# Patient Record
Sex: Male | Born: 1965 | Race: Black or African American | Hispanic: No | Smoking: Never smoker
Health system: Southern US, Community
[De-identification: ages and names within clinical notes are randomized; demographics above are authoritative.]

## PROBLEM LIST (undated history)

## (undated) DIAGNOSIS — I669 Occlusion and stenosis of unspecified cerebral artery: Secondary | ICD-10-CM

## (undated) HISTORY — PX: KNEE SURGERY: SHX244

---

## 2008-01-08 ENCOUNTER — Emergency Department (HOSPITAL_COMMUNITY): Admission: EM | Admit: 2008-01-08 | Discharge: 2008-01-08 | Payer: Self-pay | Admitting: Emergency Medicine

## 2010-11-28 ENCOUNTER — Inpatient Hospital Stay (INDEPENDENT_AMBULATORY_CARE_PROVIDER_SITE_OTHER)
Admission: RE | Admit: 2010-11-28 | Discharge: 2010-11-28 | Disposition: A | Discharge status: Home / Self Care | Payer: Private Health Insurance - Indemnity | Source: Ambulatory Visit | Attending: Family Medicine | Admitting: Family Medicine

## 2010-11-28 DIAGNOSIS — R197 Diarrhea, unspecified: Secondary | ICD-10-CM

## 2010-11-28 DIAGNOSIS — R112 Nausea with vomiting, unspecified: Secondary | ICD-10-CM

## 2010-11-28 LAB — POCT I-STAT, CHEM 8
Calcium, Ion: 1.2 mmol/L (ref 1.12–1.32)
Chloride: 104 mEq/L (ref 96–112)
Glucose, Bld: 116 mg/dL — ABNORMAL HIGH (ref 70–99)
HCT: 58 % — ABNORMAL HIGH (ref 39.0–52.0)
TCO2: 26 mmol/L (ref 0–100)

## 2010-11-28 LAB — POCT URINALYSIS DIPSTICK
Hgb urine dipstick: NEGATIVE
Nitrite: NEGATIVE
Protein, ur: 100 mg/dL — AB
Urobilinogen, UA: 1 mg/dL (ref 0.0–1.0)
pH: 5.5 (ref 5.0–8.0)

## 2011-07-03 LAB — CBC
HCT: 46.7
Platelets: 252
RDW: 13.4
WBC: 8.3

## 2011-07-03 LAB — DIFFERENTIAL
Basophils Absolute: 0
Eosinophils Absolute: 0.5
Eosinophils Relative: 7 — ABNORMAL HIGH
Lymphocytes Relative: 27
Lymphs Abs: 2.3
Neutrophils Relative %: 57

## 2011-07-03 LAB — POCT I-STAT, CHEM 8
BUN: 10
Hemoglobin: 16.3
Potassium: 3.9
Sodium: 140
TCO2: 23

## 2011-07-03 LAB — POCT CARDIAC MARKERS
CKMB, poc: 3.1
Myoglobin, poc: 93.1
Operator id: 285841

## 2013-04-06 ENCOUNTER — Emergency Department (HOSPITAL_COMMUNITY)
Admission: EM | Admit: 2013-04-06 | Discharge: 2013-04-06 | Disposition: A | Discharge status: Home / Self Care | Payer: Managed Care, Other (non HMO) | Attending: Emergency Medicine | Admitting: Emergency Medicine

## 2013-04-06 ENCOUNTER — Emergency Department (HOSPITAL_COMMUNITY): Payer: Managed Care, Other (non HMO)

## 2013-04-06 ENCOUNTER — Encounter (HOSPITAL_COMMUNITY): Payer: Self-pay | Admitting: *Deleted

## 2013-04-06 DIAGNOSIS — M545 Low back pain, unspecified: Secondary | ICD-10-CM | POA: Insufficient documentation

## 2013-04-06 DIAGNOSIS — Z8669 Personal history of other diseases of the nervous system and sense organs: Secondary | ICD-10-CM | POA: Insufficient documentation

## 2013-04-06 HISTORY — DX: Occlusion and stenosis of unspecified cerebral artery: I66.9

## 2013-04-06 LAB — URINALYSIS, ROUTINE W REFLEX MICROSCOPIC
Glucose, UA: NEGATIVE mg/dL
Hgb urine dipstick: NEGATIVE
Ketones, ur: NEGATIVE mg/dL
Protein, ur: NEGATIVE mg/dL
Urobilinogen, UA: 0.2 mg/dL (ref 0.0–1.0)

## 2013-04-06 LAB — POCT I-STAT, CHEM 8
BUN: 14 mg/dL (ref 6–23)
Calcium, Ion: 1.15 mmol/L (ref 1.12–1.23)
Creatinine, Ser: 0.8 mg/dL (ref 0.50–1.35)
Glucose, Bld: 113 mg/dL — ABNORMAL HIGH (ref 70–99)
TCO2: 24 mmol/L (ref 0–100)

## 2013-04-06 MED ORDER — METHOCARBAMOL 500 MG PO TABS: 500.0000 mg | ORAL_TABLET | Freq: Two times a day (BID) | ORAL | Status: DC

## 2013-04-06 MED ORDER — HYDROCODONE-ACETAMINOPHEN 5-325 MG PO TABS: 1.0000 | ORAL_TABLET | Freq: Four times a day (QID) | ORAL | Status: DC | PRN

## 2013-04-06 NOTE — ED Provider Notes (Signed)
History    CSN: [id number] Arrival date & time 04/06/13  0711  First MD Initiated Contact with Patient 04/06/13 0725     Chief Complaint  Patient presents with  . Flank Pain   (Consider location/radiation/quality/duration/timing/severity/associated sxs/prior Treatment) HPI Comments: Patient presents with a chief complaint of left flank pain.  He reports that he began having the pain last evening.  Pain has been intermittent.  He states that at times the pain becomes very sharp and will then ease up.  At this time he states that his pain is controlled.  He has not taken anything for pain prior to arrival.  He denies nausea, vomiting, or diarrhea.  He denies hematuria, dysuria, difficulty urinating, increased urinary frequency, or urgency.  Denies fever or chills.  He denies prior history of Kidney Stones.  He denies any recent injury or trauma to the back.  Denies any recent heavy lifting or bending.  Denies numbness or tingling.  Denies bowel or bladder incontinence.    Patient is a 47 y.o. male presenting with flank pain. The history is provided by the patient.  Flank Pain   Past Medical History  Diagnosis Date  . Blood clots in brain     in 1986   Past Surgical History  Procedure Laterality Date  . Knee surgery      in 1984   No family history on file. History  Substance Use Topics  . Smoking status: Never Smoker   . Smokeless tobacco: Not on file  . Alcohol Use: No    Review of Systems  Genitourinary: Positive for flank pain.  All other systems reviewed and are negative.    Allergies  Review of patient's allergies indicates no known allergies.  Home Medications  No current outpatient prescriptions on file. BP 126/71  Pulse 71  Temp(Src) 98 F (36.7 C) (Oral)  Resp 16  SpO2 94% Physical Exam  Nursing note and vitals reviewed. Constitutional: He appears well-developed and well-nourished. No distress.  HENT:  Head: Normocephalic and atraumatic.   Cardiovascular: Normal rate, regular rhythm and normal heart sounds.   Pulmonary/Chest: Effort normal and breath sounds normal.  Abdominal: Soft. Bowel sounds are normal. He exhibits no distension and no mass. There is no tenderness. There is CVA tenderness. There is no rebound and no guarding.  Left CVA tenderness  Musculoskeletal: Normal range of motion.  Neurological: He is alert. He has normal strength. No sensory deficit. Gait normal.  Reflex Scores:      Patellar reflexes are 2+ on the right side and 2+ on the left side.      Achilles reflexes are 2+ on the right side and 2+ on the left side. Skin: Skin is warm and dry. He is not diaphoretic.  Psychiatric: He has a normal mood and affect.    ED Course  Procedures (including critical care time) Labs Reviewed  URINALYSIS, ROUTINE W REFLEX MICROSCOPIC   Ct Abdomen Pelvis Wo Contrast  04/06/2013   *RADIOLOGY REPORT*  Clinical Data: Left flank pain.  Rule out kidney stone  CT ABDOMEN AND PELVIS WITHOUT CONTRAST  Technique:  Multidetector CT imaging of the abdomen and pelvis was performed following the standard protocol without intravenous contrast.  Comparison: None.  Findings: The lung bases appear clear.  There is no pleural or pericardial effusion.  No focal liver abnormalities identified.  The gallbladder appears normal.  No biliary dilatation.  Normal appearance of the pancreas. The spleen is unremarkable.  The adrenal glands are  normal.  Normal appearance of the right kidney.  The left kidney appears within normal limits.  There is no evidence for hydronephrosis or hydroureter.  No ureterolithiasis identified.  The urinary bladder appears normal.  Prostate gland and seminal vesicles are unremarkable.  The stomach appears normal.  The small bowel loops have a normal course and caliber.  No obstruction.  Normal appearance of the appendix.  Multiple colonic diverticula are identified.  No evidence for acute inflammation.  Normal caliber of  the abdominal aorta.  No aneurysm identified. There is no upper abdominal or pelvic adenopathy noted.  No free fluid or fluid collections within the abdomen or pelvis.  Review of the visualized osseous structures is unremarkable.  No worrisome lytic or sclerotic bone lesions.  IMPRESSION:  1.  Negative for nephrolithiasis or hydronephrosis. 2.  Colonic diverticula without acute inflammation.   Original Report Authenticated By: Signa Kell, M.D.   No diagnosis found.  MDM  Patient presenting with left flank pain that has been intermittent since yesterday.  No acute injury or trauma.  UA does not show any evidence of infection.  Patient afebrile.  No acute findings on CT ab/pelvis w/o contrast.  CT negative for nephrolithiasis.  Pain could be musculoskeletal.  Patient without any red flags of back pain.  No signs of cauda equina.  Feel that the patient is stable for discharge.  Return precautions given.  Pascal Lux Clayton, PA-C 04/06/13 1046

## 2013-04-06 NOTE — ED Provider Notes (Signed)
Medical screening examination/treatment/procedure(s) were performed by non-physician practitioner and as supervising physician I was immediately available for consultation/collaboration.    Mira Balon R Vaughn Frieze, MD 04/06/13 1602 

## 2013-04-06 NOTE — ED Notes (Addendum)
Pt reports intermittent sharp left sided flank pain started last night, 8/10 at most intense times. Pt denies trauma or injury. Denies dysuria, n/v/d. Reports no hx of kidney stones.

## 2014-07-23 IMAGING — CT CT ABD-PELV W/O CM
1 series · 14 of 32 positions shown, 17 images · non-contrast
Comparison: None.

CLINICAL DATA: Left flank pain.  Rule out kidney stone

CT ABDOMEN AND PELVIS WITHOUT CONTRAST
TECHNIQUE: Multidetector CT imaging of the abdomen and pelvis was
performed following the standard protocol without intravenous
contrast.

[Series 6: sagittal · sagittal · 0.76mm/px · 14 of 128 slices shown, 17 images]
[im 5/128  lung]
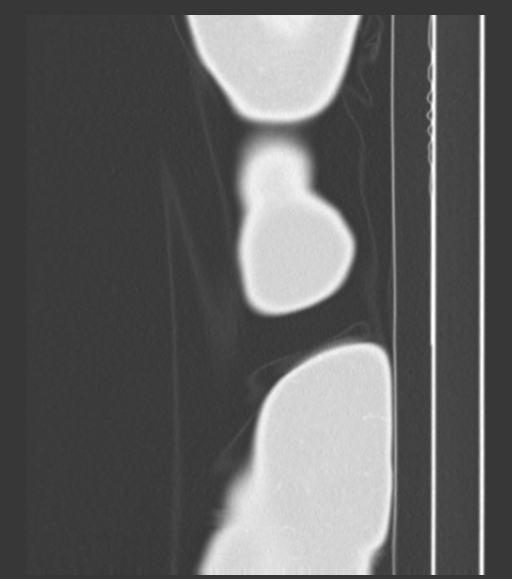
[im 9/128  soft-tissue]
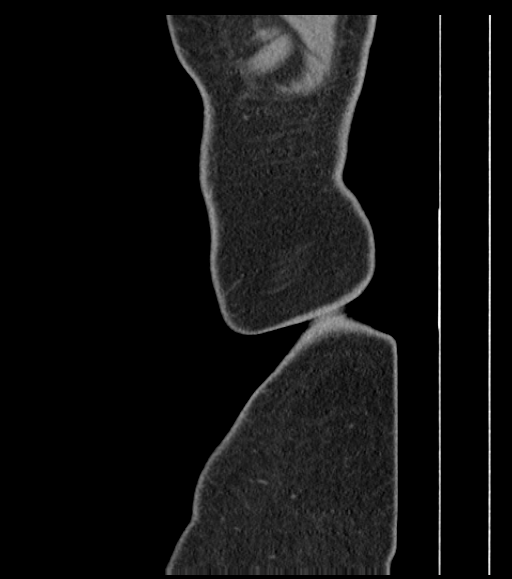
[im 9/128  lung]
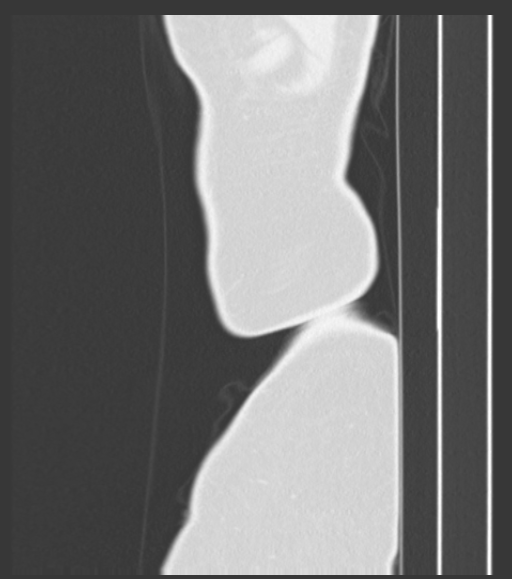
[im 9/128  bone]
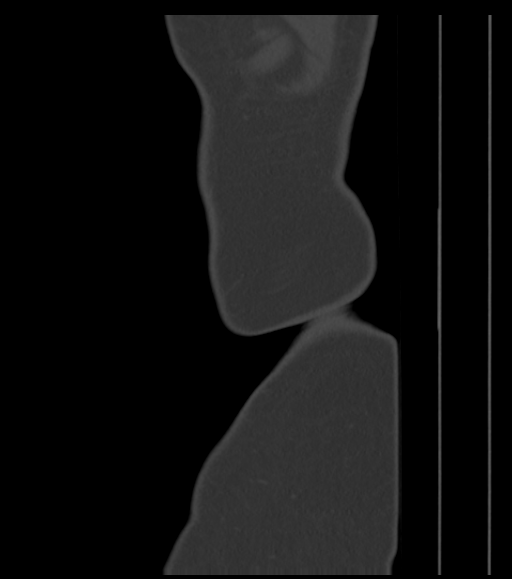
[im 13/128  lung]
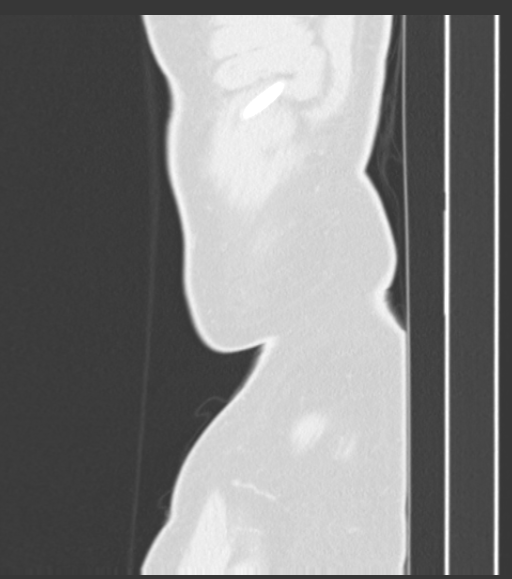
[im 17/128  lung]
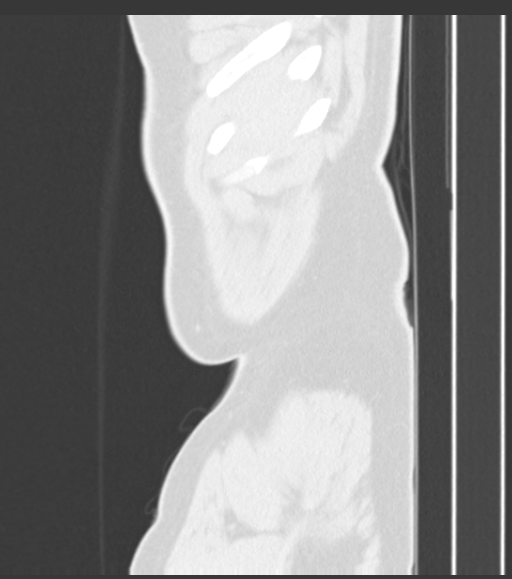
[im 21/128  soft-tissue]
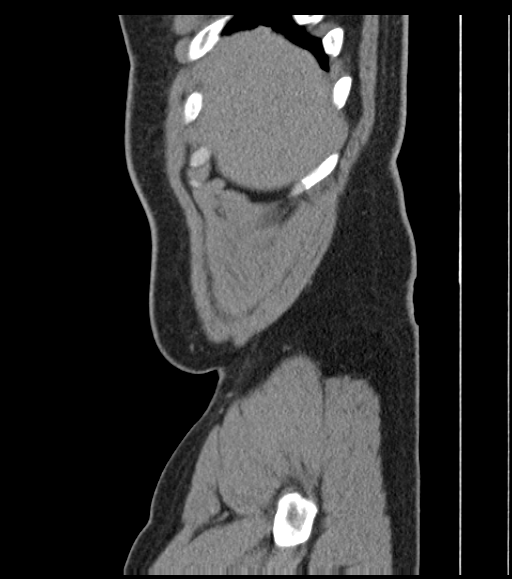
[im 29/128  soft-tissue]
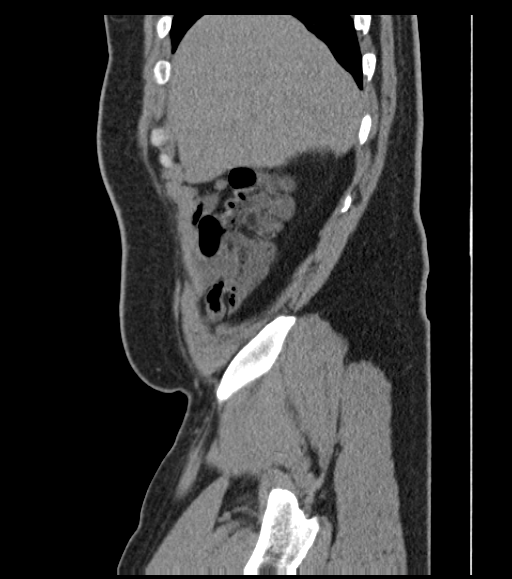
[im 41/128  soft-tissue]
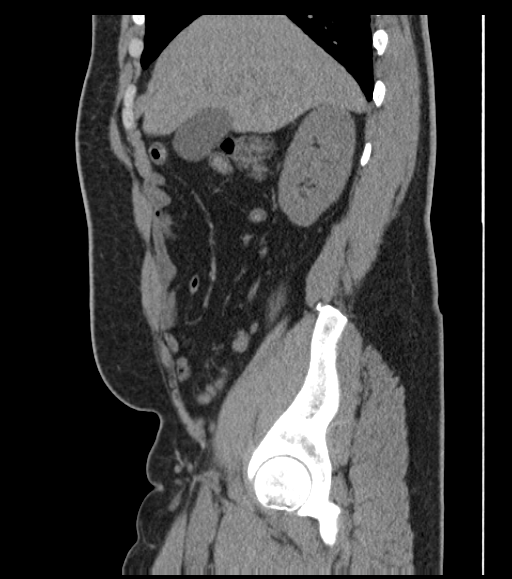
[im 54/128  soft-tissue]
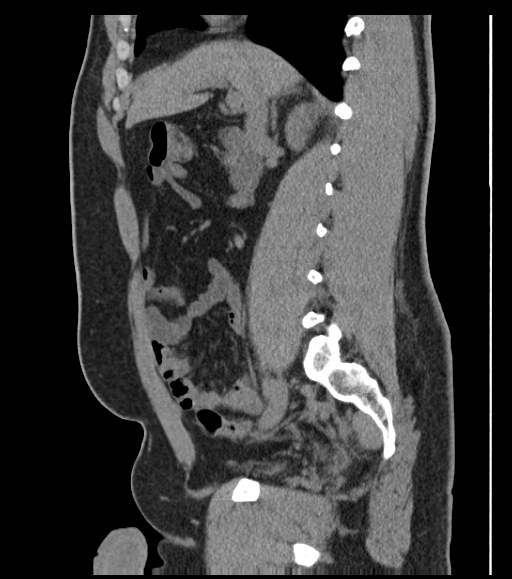
[im 66/128  soft-tissue]
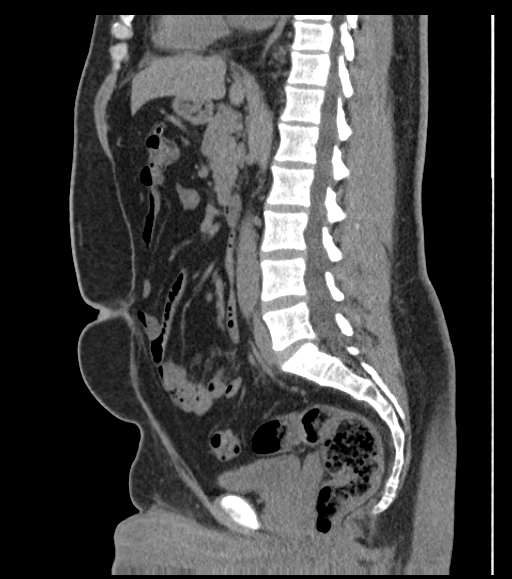
[im 74/128  soft-tissue]
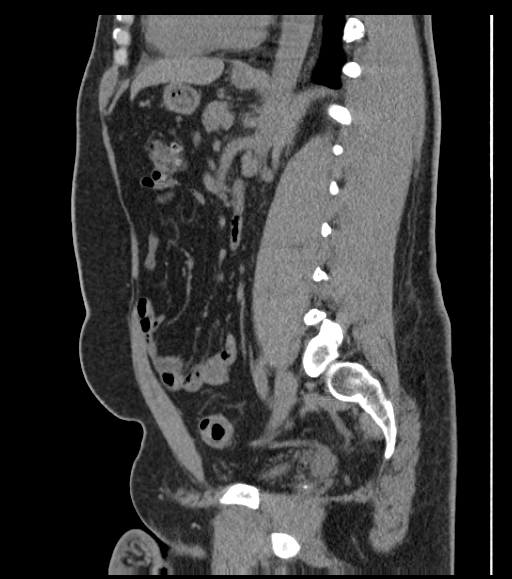
[im 87/128  soft-tissue]
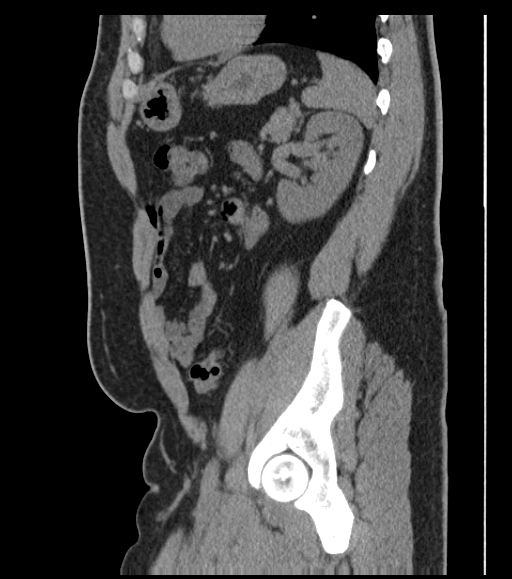
[im 99/128  soft-tissue]
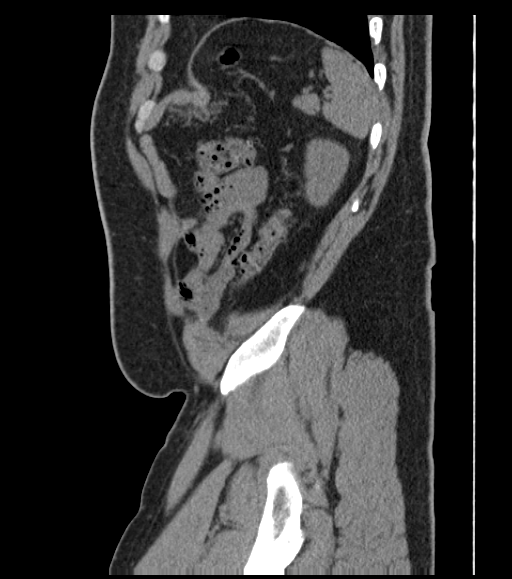
[im 99/128  bone]
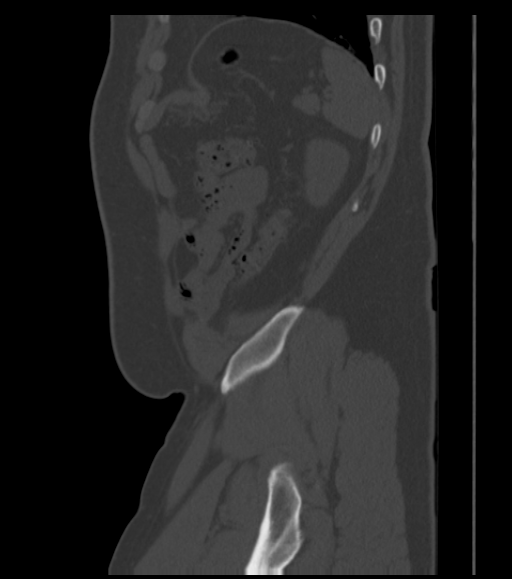
[im 107/128  soft-tissue]
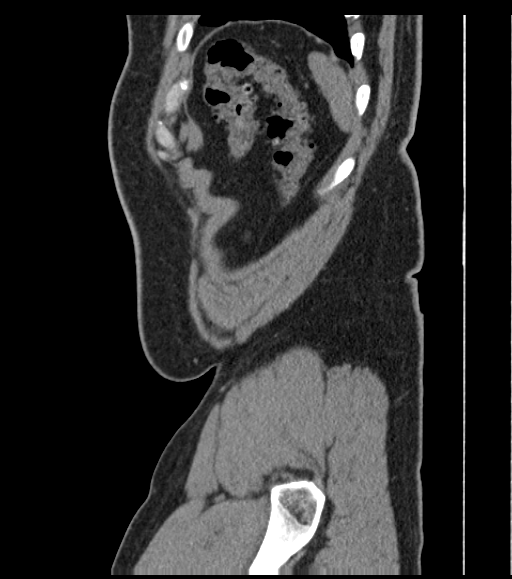
[im 119/128  soft-tissue]
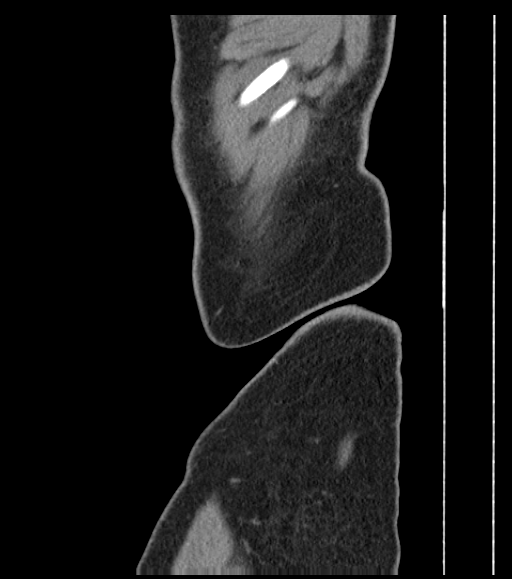

[14 of 32 positions shown; findings below may reference images not displayed]

FINDINGS: The lung bases appear clear.  There is no pleural or
pericardial effusion.

No focal liver abnormalities identified.  The gallbladder appears
normal.  No biliary dilatation.  Normal appearance of the pancreas.
The spleen is unremarkable.

The adrenal glands are normal.  Normal appearance of the right
kidney.  The left kidney appears within normal limits.  There is no
evidence for hydronephrosis or hydroureter.  No ureterolithiasis
identified.  The urinary bladder appears normal.  Prostate gland
and seminal vesicles are unremarkable.

The stomach appears normal.  The small bowel loops have a normal
course and caliber.  No obstruction.  Normal appearance of the
appendix.  Multiple colonic diverticula are identified.  No
evidence for acute inflammation.

Normal caliber of the abdominal aorta.  No aneurysm identified.
There is no upper abdominal or pelvic adenopathy noted.  No free
fluid or fluid collections within the abdomen or pelvis.

Review of the visualized osseous structures is unremarkable.  No
worrisome lytic or sclerotic bone lesions.
IMPRESSION: 1.  Negative for nephrolithiasis or hydronephrosis.
2.  Colonic diverticula without acute inflammation.

## 2016-09-10 ENCOUNTER — Encounter (HOSPITAL_COMMUNITY): Payer: Self-pay | Admitting: Emergency Medicine

## 2016-09-10 ENCOUNTER — Ambulatory Visit (HOSPITAL_COMMUNITY)
Admission: EM | Admit: 2016-09-10 | Discharge: 2016-09-10 | Disposition: A | Discharge status: Home / Self Care | Payer: BLUE CROSS/BLUE SHIELD | Attending: Family Medicine | Admitting: Family Medicine

## 2016-09-10 DIAGNOSIS — H6121 Impacted cerumen, right ear: Secondary | ICD-10-CM | POA: Diagnosis not present

## 2016-09-10 NOTE — ED Triage Notes (Signed)
[facility] a complaint of right ear pain  and congestion x 4 days.

## 2016-09-10 NOTE — ED Provider Notes (Signed)
CSN: [id number]     Arrival date & time 09/10/16  1618 History   None    Chief Complaint  Patient presents with  . Otalgia   (Consider location/radiation/quality/duration/timing/severity/associated sxs/prior Treatment) Patient c/o right ear discomfort.   The history is provided by the patient.  Otalgia  Location:  Right Behind ear:  No abnormality Quality:  Dull Severity:  Mild Onset quality:  Sudden Duration:  1 day Timing:  Constant Progression:  Waxing and waning Chronicity:  New Relieved by:  Nothing Worsened by:  Nothing   Past Medical History:  Diagnosis Date  . Blood clots in brain    in 1986   Past Surgical History:  Procedure Laterality Date  . KNEE SURGERY     in 1984   History reviewed. No pertinent family history. Social History  Substance Use Topics  . Smoking status: Never Smoker  . Smokeless tobacco: Not on file  . Alcohol use No    Review of Systems  Constitutional: Negative.   HENT: Positive for ear pain.   Eyes: Negative.   Respiratory: Negative.   Cardiovascular: Negative.   Gastrointestinal: Negative.   Endocrine: Negative.   Genitourinary: Negative.   Musculoskeletal: Negative.   Skin: Negative.   Allergic/Immunologic: Negative.   Neurological: Negative.   Hematological: Negative.   Psychiatric/Behavioral: Negative.     Allergies  Patient has no known allergies.  Home Medications   Prior to Admission medications   Medication Sig Start Date End Date Taking? Authorizing Provider  acetaminophen (TYLENOL) 500 MG tablet Take 1,000 mg by mouth every 6 (six) hours as needed for pain.    Historical Provider, MD  HYDROcodone-acetaminophen (NORCO/VICODIN) 5-325 MG per tablet Take 1-2 tablets by mouth every 6 (six) hours as needed for pain. 04/06/13   Heather Laisure, PA-C  methocarbamol (ROBAXIN) 500 MG tablet Take 1 tablet (500 mg total) by mouth 2 (two) times daily. 04/06/13   Santiago GladHeather Laisure, PA-C   Meds Ordered and Administered this  Visit  Medications - No data to display  BP 150/66 (BP Location: Right Arm)   Pulse 64   Temp 97.9 F (36.6 C) (Oral)   Resp 16   SpO2 97%  No data found.   Physical Exam  Constitutional: He is oriented to person, place, and time. He appears well-developed and well-nourished.  HENT:  Head: Normocephalic.  Left Ear: External ear normal.  Mouth/Throat: Oropharynx is clear and moist.  Right EAC with cerumen impaction.  After irrigation of right ear TM is wnl.  Eyes: Conjunctivae and EOM are normal. Pupils are equal, round, and reactive to light.  Neck: Normal range of motion. Neck supple.  Cardiovascular: Normal rate, regular rhythm and normal heart sounds.   Pulmonary/Chest: Effort normal and breath sounds normal.  Abdominal: Soft. Bowel sounds are normal.  Neurological: He is alert and oriented to person, place, and time.  Nursing note and vitals reviewed.   Urgent Care Course   Clinical Course     Procedures (including critical care time)  Labs Review Labs Reviewed - No data to display  Imaging Review No results found.   Visual Acuity Review  Right Eye Distance:   Left Eye Distance:   Bilateral Distance:    Right Eye Near:   Left Eye Near:    Bilateral Near:         MDM  Cerumen impaction right ear - Ear discomfort is resolved and patient feels a lot better.      Deatra CanterWilliam J Noha Milberger,  FNP 09/10/16 1712

## 2017-09-25 ENCOUNTER — Encounter (HOSPITAL_COMMUNITY): Payer: Self-pay | Admitting: Emergency Medicine

## 2017-09-25 ENCOUNTER — Ambulatory Visit (HOSPITAL_COMMUNITY)
Admission: EM | Admit: 2017-09-25 | Discharge: 2017-09-25 | Disposition: A | Discharge status: Home / Self Care | Payer: BLUE CROSS/BLUE SHIELD | Attending: Family Medicine | Admitting: Family Medicine

## 2017-09-25 ENCOUNTER — Other Ambulatory Visit: Payer: Self-pay

## 2017-09-25 DIAGNOSIS — R300 Dysuria: Secondary | ICD-10-CM | POA: Insufficient documentation

## 2017-09-25 DIAGNOSIS — Z833 Family history of diabetes mellitus: Secondary | ICD-10-CM | POA: Insufficient documentation

## 2017-09-25 DIAGNOSIS — J069 Acute upper respiratory infection, unspecified: Secondary | ICD-10-CM | POA: Diagnosis not present

## 2017-09-25 DIAGNOSIS — Z8249 Family history of ischemic heart disease and other diseases of the circulatory system: Secondary | ICD-10-CM | POA: Insufficient documentation

## 2017-09-25 LAB — POCT URINALYSIS DIP (DEVICE)
BILIRUBIN URINE: NEGATIVE
GLUCOSE, UA: NEGATIVE mg/dL
HGB URINE DIPSTICK: NEGATIVE
KETONES UR: NEGATIVE mg/dL
LEUKOCYTES UA: NEGATIVE
Nitrite: NEGATIVE
Protein, ur: NEGATIVE mg/dL
SPECIFIC GRAVITY, URINE: 1.015 (ref 1.005–1.030)
UROBILINOGEN UA: 0.2 mg/dL (ref 0.0–1.0)
pH: 6 (ref 5.0–8.0)

## 2017-09-25 NOTE — Discharge Instructions (Signed)
We have sent testing to screen for sexually transmitted infections. We will notify you of any positive results once they are received. If required, we will prescribe any medications you might need.

## 2017-09-25 NOTE — ED Triage Notes (Signed)
Burning with urination at end of stream.  Denies penile discharge. This started 2 days ago  Right eye red, tearing started yesterday, head and throat congestion noticed today

## 2017-09-25 NOTE — ED Notes (Signed)
Sent to bathroom to obtain a dirty and clean urine with instructions

## 2017-09-25 NOTE — ED Provider Notes (Signed)
  [facility]    ASSESSMENT & PLAN:  1. Dysuria   2. URI, acute    U/A normal. Urine cytology sent. Will notify patient with any positive results.  Will follow if not showing improvement over the next 48 hours, sooner if needed.  OTC treatment for early URI.  Outlined signs and symptoms indicating need for more acute intervention. Patient verbalized understanding. After Visit Summary given.  SUBJECTIVE:  Manuel Alexander is a 51 y.o. male who complains of dysuria at end of urination. Onset approximately 2-3 days ago and improving. No flank pain, fever, chills, penile discharge or abdominal pain. No self treatment. Ambulatory without difficulty.  Also thinks he is coming down with a cold. Describes nasal congestion, watery eyes, scratchy throat. Onset approx 2 days ago and slightly worsening. Afebrile. No respiratory symptoms.  ROS: As in HPI.  OBJECTIVE:  Vitals:   09/25/17 1313  BP: 140/88  Pulse: 63  Resp: 18  Temp: 97.9 F (36.6 C)  TempSrc: Oral  SpO2: 97%   Appears well, in no apparent distress. Eyes are watery without significant injection. Throat with mild cobblestoning. Lungs are CTAB. Abdomen is soft without tenderness, guarding, mass, rebound or organomegaly. No CVA tenderness or inguinal adenopathy noted.  Labs Reviewed  POCT URINALYSIS DIP (DEVICE)  URINE CYTOLOGY ANCILLARY ONLY    No Known Allergies  Past Medical History:  Diagnosis Date  . Blood clots in brain    in 1986   Social History   Socioeconomic History  . Marital status: Divorced    Spouse name: Not on file  . Number of children: Not on file  . Years of education: Not on file  . Highest education level: Not on file  Social Needs  . Financial resource strain: Not on file  . Food insecurity - worry: Not on file  . Food insecurity - inability: Not on file  . Transportation needs - medical: Not on file  . Transportation needs - non-medical: Not on file  Occupational  History  . Not on file  Tobacco Use  . Smoking status: Never Smoker  Substance and Sexual Activity  . Alcohol use: No  . Drug use: No  . Sexual activity: Not on file  Other Topics Concern  . Not on file  Social History Narrative  . Not on file   Family History  Problem Relation Age of Onset  . Diabetes Mother   . Hypertension Mother   . Diabetes Father        Mardella LaymanHagler, Nathaly Dawkins, MD 09/25/17 [phone]

## 2017-09-26 LAB — URINE CYTOLOGY ANCILLARY ONLY
CHLAMYDIA, DNA PROBE: NEGATIVE
Neisseria Gonorrhea: NEGATIVE
Trichomonas: NEGATIVE

## 2019-11-30 ENCOUNTER — Encounter: Payer: Self-pay | Admitting: Gastroenterology

## 2019-12-28 ENCOUNTER — Ambulatory Visit (AMBULATORY_SURGERY_CENTER): Payer: Self-pay | Admitting: *Deleted

## 2019-12-28 ENCOUNTER — Other Ambulatory Visit: Payer: Self-pay

## 2019-12-28 VITALS — Temp 97.5°F | Ht 65.0 in | Wt 199.0 lb

## 2019-12-28 DIAGNOSIS — Z1211 Encounter for screening for malignant neoplasm of colon: Secondary | ICD-10-CM

## 2019-12-28 MED ORDER — NA SULFATE-K SULFATE-MG SULF 17.5-3.13-1.6 GM/177ML PO SOLN: 1.0000 | Freq: Once | ORAL | 0 refills | Status: AC

## 2019-12-28 NOTE — Progress Notes (Signed)

## 2020-01-11 ENCOUNTER — Encounter: Payer: Self-pay | Admitting: Gastroenterology

## 2020-01-11 ENCOUNTER — Other Ambulatory Visit: Payer: Self-pay

## 2020-01-11 ENCOUNTER — Ambulatory Visit (AMBULATORY_SURGERY_CENTER): Payer: 59 | Admitting: Gastroenterology

## 2020-01-11 VITALS — BP 121/66 | HR 70 | Temp 96.8°F | Resp 16 | Ht 65.0 in | Wt 199.0 lb

## 2020-01-11 DIAGNOSIS — Z1211 Encounter for screening for malignant neoplasm of colon: Secondary | ICD-10-CM | POA: Diagnosis present

## 2020-01-11 MED ORDER — SODIUM CHLORIDE 0.9 % IV SOLN: 500.0000 mL | Freq: Once | INTRAVENOUS | Status: DC

## 2020-01-11 NOTE — Progress Notes (Signed)
Pt's states no medical or surgical changes since previsit or office visit. 

## 2020-01-11 NOTE — Progress Notes (Signed)
To PACU, VSS. Reort to [email or web address].tb

## 2020-01-11 NOTE — Patient Instructions (Signed)
Handouts provided on diverticulosis and high-fiber diet.   Follow a high fiber diet. Drink at least 64 ounces of water daily. Add a daily stool bulking agent such as psyllium (an example would be Metamucil).  YOU HAD AN ENDOSCOPIC PROCEDURE TODAY AT THE [facility]:   Refer to the procedure report that was given to you for any specific questions about what was found during the examination.  If the procedure report does not answer your questions, please call your gastroenterologist to clarify.  If you requested that your care partner not be given the details of your procedure findings, then the procedure report has been included in a sealed envelope for you to review at your convenience later.  YOU SHOULD EXPECT: Some feelings of bloating in the abdomen. Passage of more gas than usual.  Walking can help get rid of the air that was put into your GI tract during the procedure and reduce the bloating. If you had a lower endoscopy (such as a colonoscopy or flexible sigmoidoscopy) you may notice spotting of blood in your stool or on the toilet paper. If you underwent a bowel prep for your procedure, you may not have a normal bowel movement for a few days.  Please Note:  You might notice some irritation and congestion in your nose or some drainage.  This is from the oxygen used during your procedure.  There is no need for concern and it should clear up in a day or so.  SYMPTOMS TO REPORT IMMEDIATELY:   Following lower endoscopy (colonoscopy or flexible sigmoidoscopy):  Excessive amounts of blood in the stool  Significant tenderness or worsening of abdominal pains  Swelling of the abdomen that is new, acute  Fever of 100F or higher  For urgent or emergent issues, a gastroenterologist can be reached at any hour by calling [phone]. Do not use MyChart messaging for urgent concerns.    DIET:  We do recommend a small meal at first, but then you may proceed to your regular diet.  Drink  plenty of fluids but you should avoid alcoholic beverages for 24 hours.  ACTIVITY:  You should plan to take it easy for the rest of today and you should NOT DRIVE or use heavy machinery until tomorrow (because of the sedation medicines used during the test).    FOLLOW UP: Our staff will call the number listed on your records 48-72 hours following your procedure to check on you and address any questions or concerns that you may have regarding the information given to you following your procedure. If we do not reach you, we will leave a message.  We will attempt to reach you two times.  During this call, we will ask if you have developed any symptoms of COVID 19. If you develop any symptoms (ie: fever, flu-like symptoms, shortness of breath, cough etc.) before then, please call [phone].  If you test positive for Covid 19 in the 2 weeks post procedure, please call and report this information to us.    If any biopsies were taken you will be contacted by phone or by letter within the next 1-3 weeks.  Please call us at [phone] if you have not heard about the biopsies in 3 weeks.    SIGNATURES/CONFIDENTIALITY: You and/or your care partner have signed paperwork which will be entered into your electronic medical record.  These signatures attest to the fact that that the information above on your After Visit Summary has been reviewed and   is understood.  Full responsibility of the confidentiality of this discharge information lies with you and/or your care-partner.

## 2020-01-11 NOTE — Op Note (Signed)
[facility] Patient Name: Manuel Alexander Procedure Date: 01/11/2020 8:27 AM MRN: [id number] Endoscopist: Tressia Danas MD, MD Age: 54 Referring MD:  Date of Birth: [birth year] Gender: Male Account #: [id number] Procedure:                Colonoscopy Indications:              Screening for colorectal malignant neoplasm, This                            is the patient's first colonoscopy                           No known family history of colon cancer or polyps Medicines:                Monitored Anesthesia Care Procedure:                Pre-Anesthesia Assessment:                           - Prior to the procedure, a History and Physical                            was performed, and patient medications and                            allergies were reviewed. The patient's tolerance of                            previous anesthesia was also reviewed. The risks                            and benefits of the procedure and the sedation                            options and risks were discussed with the patient.                            All questions were answered, and informed consent                            was obtained. Prior Anticoagulants: The patient has                            taken no previous anticoagulant or antiplatelet                            agents. ASA Grade Assessment: II - A patient with                            mild systemic disease. After reviewing the risks                            and benefits, the patient was deemed in  satisfactory condition to undergo the procedure.                           After obtaining informed consent, the colonoscope                            was passed under direct vision. Throughout the                            procedure, the patient's blood pressure, pulse, and                            oxygen saturations were monitored continuously. The                            Colonoscope was  introduced through the anus and                            advanced to the 4 cm into the ileum. A second                            forward view of the right colon was performed. The                            colonoscopy was performed without difficulty. The                            patient tolerated the procedure well. The quality                            of the bowel preparation was good. The terminal                            ileum, ileocecal valve, appendiceal orifice, and                            rectum were photographed. Scope In: 8:33:18 AM Scope Out: 8:49:24 AM Scope Withdrawal Time: 0 hours 14 minutes 20 seconds  Total Procedure Duration: 0 hours 16 minutes 6 seconds  Findings:                 The perianal and digital rectal examinations were                            normal.                           Multiple small and large-mouthed diverticula were                            found in the entire colon.                           The entire examined colon and terminal ileum  appeared normal on direct and retroflexion views. Complications:            No immediate complications. Estimated Blood Loss:     Estimated blood loss: none. Impression:               - Diverticulosis in the entire examined colon.                           - The entire examined colon is normal on direct and                            retroflexion views.                           - No specimens collected. Recommendation:           - Patient has a contact number available for                            emergencies. The signs and symptoms of potential                            delayed complications were discussed with the                            patient. Return to normal activities tomorrow.                            Written discharge instructions were provided to the                            patient.                           - Continue present medications.                            - Repeat colonoscopy in 10 years for screening                            purposes, earlier with any new symptoms.                           - Follow a high fiber diet. Drink at least 64                            ounces of water daily. Add a daily stool bulking                            agent such as psyllium (an exampled would be                            Metamucil).                           - Emerging evidence supports eating a diet of  fruits, vegetables, grains, calcium, and yogurt                            while reducing red meat and alcohol may reduce the                            risk of colon cancer.                           - Thank you for allowing me to be involved in your                            colon cancer prevention. Tressia Danas MD, MD 01/11/2020 8:54:17 AM This report has been signed electronically.

## 2020-01-13 ENCOUNTER — Telehealth: Payer: Self-pay

## 2020-01-13 NOTE — Telephone Encounter (Signed)
  Follow up Call-  Call back number 01/11/2020  Post procedure Call Back phone  # [phone]  Permission to leave phone message Yes  Some recent data might be hidden     Patient questions:  Do you have a fever, pain , or abdominal swelling? No. Pain Score  0 *  Have you tolerated food without any problems? Yes.    Have you been able to return to your normal activities? Yes.    Do you have any questions about your discharge instructions: Diet   No. Medications  No. Follow up visit  No.  Do you have questions or concerns about your Care? No.  Actions: * If pain score is 4 or above: No action needed, pain <4. 1. Have you developed a fever since your procedure? no  2.   Have you had an respiratory symptoms (SOB or cough) since your procedure? no  3.   Have you tested positive for COVID 19 since your procedure no  4.   Have you had any family members/close contacts diagnosed with the COVID 19 since your procedure?  no   If yes to any of these questions please route to Laverna Peace, RN and Charlett Lango, RN

## 2023-02-19 ENCOUNTER — Ambulatory Visit: Payer: Self-pay | Admitting: Nurse Practitioner
